# Patient Record
Sex: Male | Born: 1960 | Race: White | Hispanic: No | Marital: Married
Health system: Southern US, Community
[De-identification: ages and names within clinical notes are randomized; demographics above are authoritative.]

---

## 2017-08-25 ENCOUNTER — Emergency Department (HOSPITAL_COMMUNITY)
Admission: EM | Admit: 2017-08-25 | Discharge: 2017-08-25 | Disposition: A | Discharge status: Home / Self Care | Payer: No Typology Code available for payment source | Attending: Emergency Medicine | Admitting: Emergency Medicine

## 2017-08-25 ENCOUNTER — Emergency Department (HOSPITAL_COMMUNITY): Payer: No Typology Code available for payment source

## 2017-08-25 DIAGNOSIS — Y999 Unspecified external cause status: Secondary | ICD-10-CM | POA: Diagnosis not present

## 2017-08-25 DIAGNOSIS — Y9389 Activity, other specified: Secondary | ICD-10-CM | POA: Diagnosis not present

## 2017-08-25 DIAGNOSIS — Y929 Unspecified place or not applicable: Secondary | ICD-10-CM | POA: Diagnosis not present

## 2017-08-25 DIAGNOSIS — M542 Cervicalgia: Secondary | ICD-10-CM | POA: Insufficient documentation

## 2017-08-25 DIAGNOSIS — R079 Chest pain, unspecified: Secondary | ICD-10-CM | POA: Diagnosis present

## 2017-08-25 MED ORDER — NAPROXEN 500 MG PO TABS: 500.0000 mg | ORAL_TABLET | Freq: Two times a day (BID) | ORAL | 0 refills | Status: AC
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
## 2017-08-25 NOTE — ED Notes (Signed)
Declined W/C at D/C and was escorted to lobby by RN. 

## 2017-08-29 ENCOUNTER — Emergency Department (HOSPITAL_COMMUNITY)
Admission: EM | Admit: 2017-08-29 | Discharge: 2017-08-29 | Disposition: A | Discharge status: Home / Self Care | Payer: No Typology Code available for payment source | Attending: Emergency Medicine | Admitting: Emergency Medicine

## 2017-08-29 ENCOUNTER — Emergency Department (HOSPITAL_COMMUNITY): Payer: No Typology Code available for payment source

## 2017-08-29 DIAGNOSIS — R079 Chest pain, unspecified: Secondary | ICD-10-CM | POA: Diagnosis not present

## 2017-08-29 DIAGNOSIS — Z5321 Procedure and treatment not carried out due to patient leaving prior to being seen by health care provider: Secondary | ICD-10-CM | POA: Insufficient documentation

## 2017-08-29 NOTE — ED Notes (Signed)
Patient stated he was going to leave and couldn't wait any longer. He reports feeling a little better than when he came in and would rather come back tomorrow or go somewhere else. Patient in no acute distress, ambulatory with steady gait. Resp e/u.
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
## 2017-08-29 NOTE — ED Notes (Signed)
Called 3x no response 

## 2019-05-15 IMAGING — DX DG CHEST 2V
3 series · 3 of 3 positions shown · non-contrast
Comparison: 08/25/2017

CLINICAL DATA: Shortness of breath

EXAM:
CHEST  2 VIEW

[w chest pa]
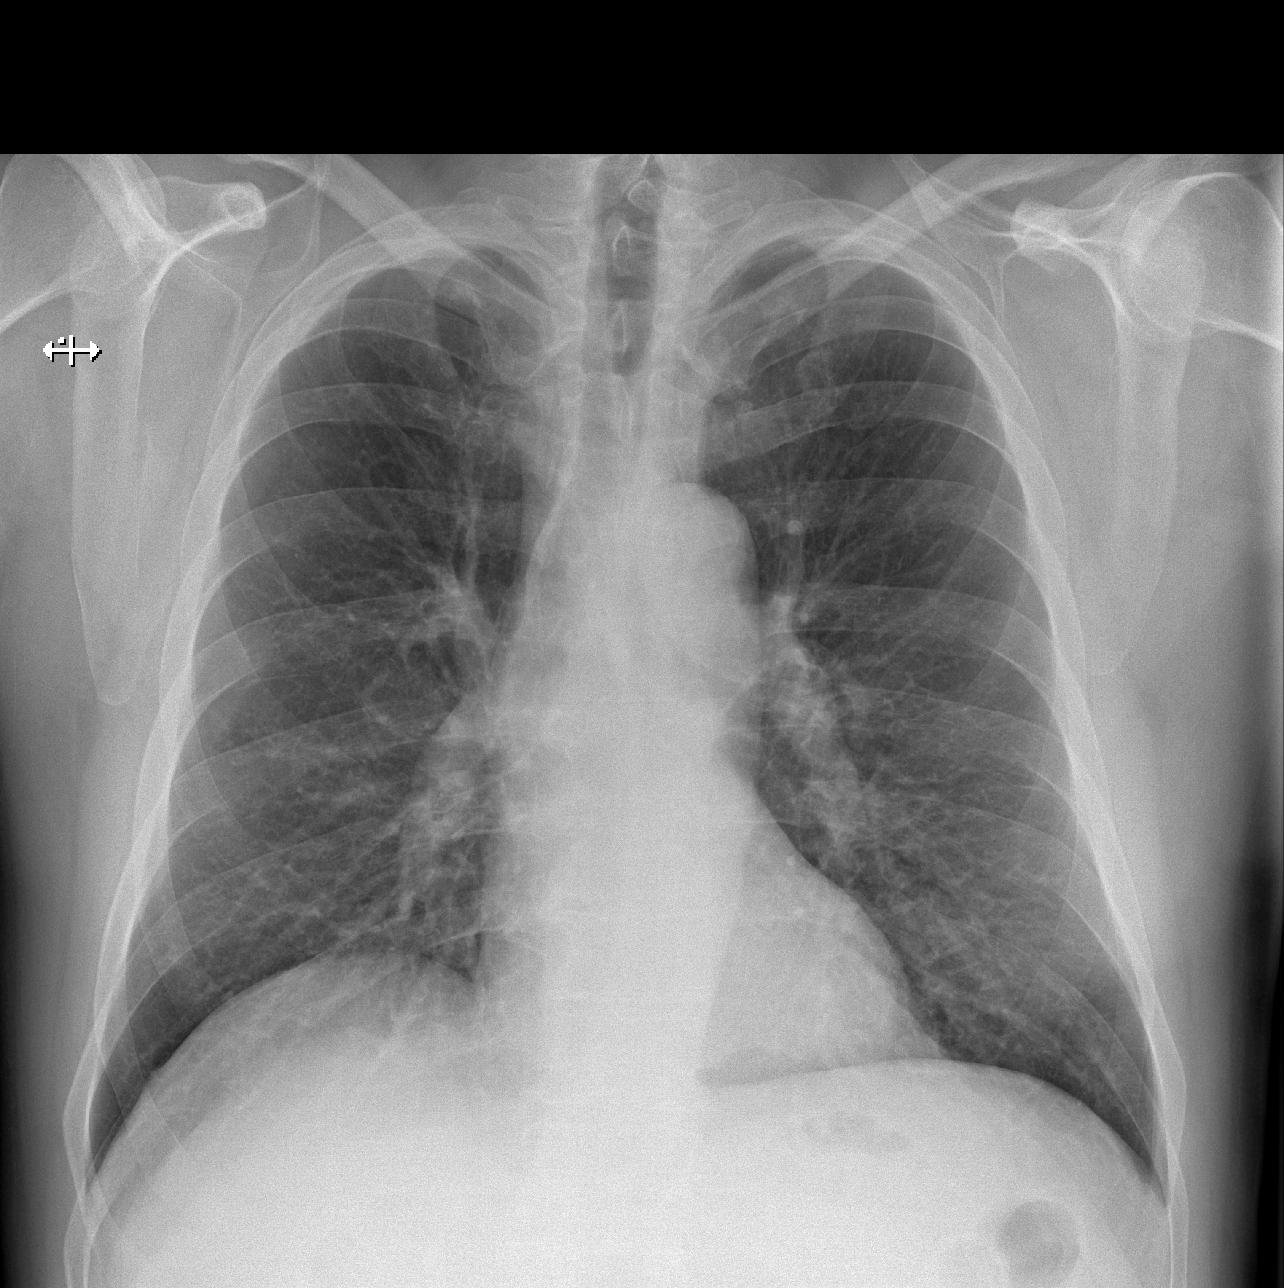

[w chest lat (1 of 2)]
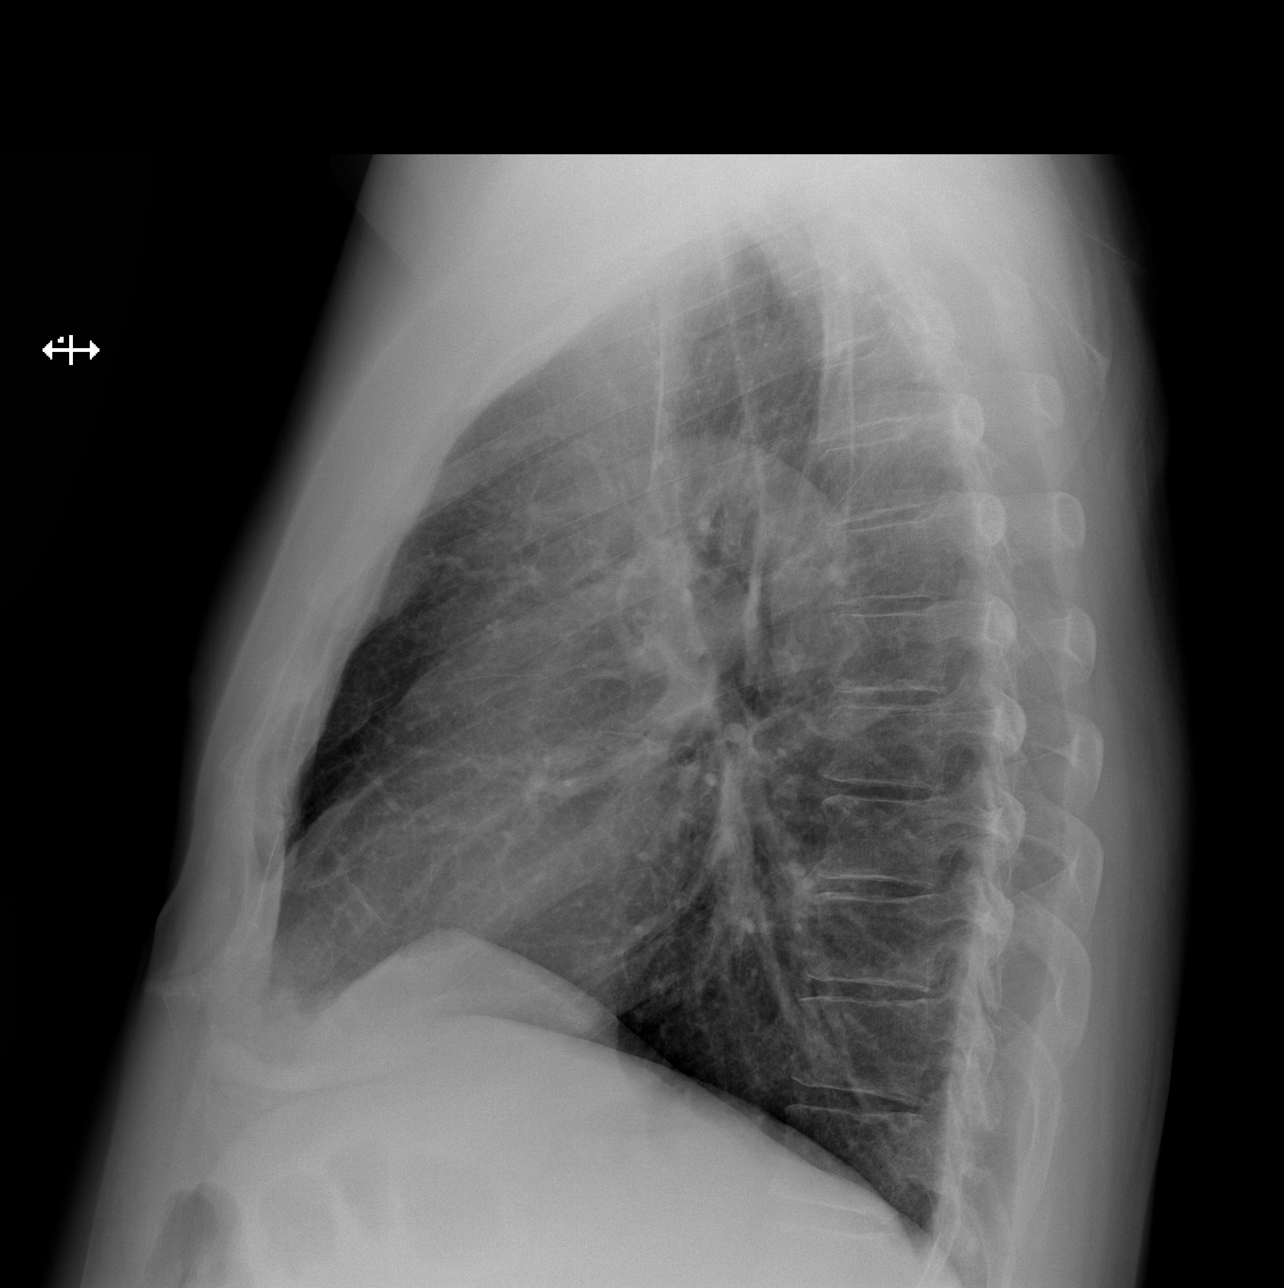

[w chest lat (2 of 2)]
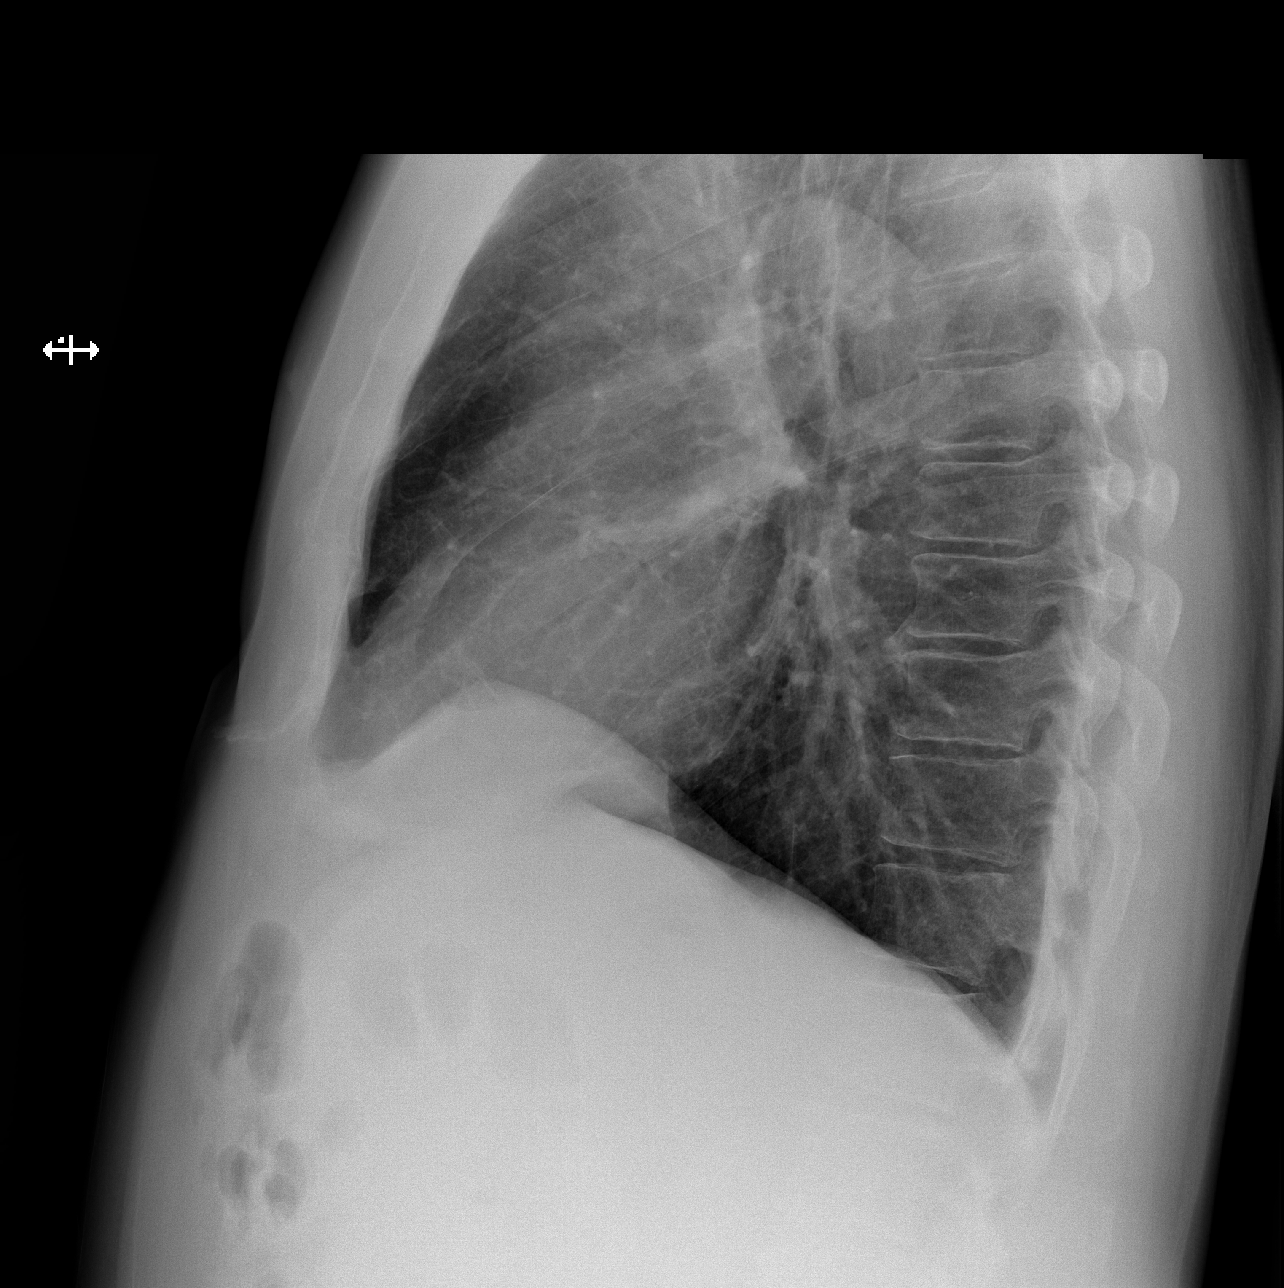

[3 of 3 positions shown; findings below may reference images not displayed]

FINDINGS: Normal heart size. Unremarkable mediastinal contours. There is no
edema, consolidation, effusion, or pneumothorax.
IMPRESSION: No evidence of active disease.

## 2021-02-22 ENCOUNTER — Emergency Department (HOSPITAL_COMMUNITY)
Admission: EM | Admit: 2021-02-22 | Discharge: 2021-03-25 | Disposition: E | Payer: Self-pay | Attending: Emergency Medicine | Admitting: Emergency Medicine

## 2021-02-22 DIAGNOSIS — I469 Cardiac arrest, cause unspecified: Secondary | ICD-10-CM | POA: Insufficient documentation

## 2021-02-22 MED ORDER — EPINEPHRINE 1 MG/10ML IJ SOSY
PREFILLED_SYRINGE | INTRAMUSCULAR | Status: AC | PRN
  Administered 2021-02-22 (×2): 1 mg via INTRAVENOUS

## 2021-02-22 MED ORDER — SODIUM BICARBONATE 8.4 % IV SOLN
INTRAVENOUS | Status: AC | PRN
  Administered 2021-02-22 (×2): 50 meq via INTRAVENOUS

## 2021-03-25 NOTE — ED Triage Notes (Signed)
Patient arrives with [facility] from home, patient with "PNA-like symptoms" x 3 days, EMS stated wife heard him fall in the bathroom, initial rhythm PEA, 14 shocks, 11 epi, 450 amio, bicarb and calcium given, 7.0 ETT 22 at teeth, initial downtime start 0054.

## 2021-03-25 NOTE — ED Provider Notes (Signed)
[facility] EMERGENCY DEPARTMENT Provider Note   CSN: [id number] Arrival date & time: 19-Mar-2021  0217     History Chief Complaint  Patient presents with  . Cardiac Arrest    Frank Brown is a 60 y.o. male.  HPI     This 60 year old male with no reported past medical history who presents in cardiac arrest.  Per EMS they started CPR at 055.  Wife noted over the day that he had had increasing shortness of breath.  He used a portable pulse ox and noted that his heart rate was elevated.  Early this morning, he went to the bathroom and the wife heard a thud.  She found him on the floor.  Upon EMS arrival he was pulseless.  EMS reports that he went between PEA and V. fib.  He received approximately 11 doses of epinephrine and 11 shocks in the field.  He also received calcium, bicarbonate, and 450 of amiodarone.  Upon arrival to the trauma bay, he remains in PEA.  He has not had no meaningful neurologic movement.  Pupils have been fixed and dilated per EMS for the duration of their resuscitation.  Level 5 caveat for unresponsiveness.  No past medical history on file.  There are no problems to display for this patient.  Unknown PMH  No family history on file.     Home Medications Prior to Admission medications   Medication Sig Start Date End Date Taking? Authorizing Provider  naproxen (NAPROSYN) 500 MG tablet Take 1 tablet (500 mg total) by mouth 2 (two) times daily. 08/25/17   Maczis, Elmer Sow, PA-C    Allergies    Patient has no known allergies.  Review of Systems   Review of Systems  Unable to perform ROS: Intubated    Physical Exam Updated Vital Signs BP (!) 0/0   Pulse (!) 0   Resp (!) 0   Physical Exam Vitals and nursing note reviewed.  Constitutional:      Comments: Spots of, intubated, GCS 3  HENT:     Head:     Comments: Abrasion over the forehead    Nose: Nose normal.  Eyes:     Comments: Pupils 9 mm bilaterally, fixed  Cardiovascular:      Comments: PEA, pulseless Pulmonary:     Comments: No respiratory effort, good end-tidal CO2 Abdominal:     General: There is distension.     Palpations: Abdomen is soft.  Musculoskeletal:        General: No swelling.     Comments: IO left tibia  Skin:    Comments: Pale  Neurological:     Comments: GCS 3  Psychiatric:     Comments: Unable to assess     ED Results / Procedures / Treatments   Labs (all labs ordered are listed, but only abnormal results are displayed) Labs Reviewed - No data to display  EKG None  Radiology No results found.  Procedures .Critical Care Performed by: Shon Baton, MD Authorized by: Shon Baton, MD   Critical care provider statement:    Critical care time (minutes):  45   Critical care was necessary to treat or prevent imminent or life-threatening deterioration of the following conditions:  Cardiac failure and circulatory failure   Critical care was time spent personally by me on the following activities:  Discussions with consultants, evaluation of patient's response to treatment, examination of patient, ordering and performing treatments and interventions, ordering and review of laboratory  studies, ordering and review of radiographic studies, pulse oximetry, re-evaluation of patient's condition, obtaining history from patient or surrogate and review of old charts CPR  Date/Time: 27-Feb-2021 2:41 AM Performed by: Shon Baton, MD Authorized by: Shon Baton, MD  CPR Procedure Details:    ACLS/BLS initiated by EMS: Yes     CPR/ACLS performed in the ED: Yes     Duration of CPR (minutes):  10   Outcome: Pt declared dead    CPR performed via ACLS guidelines under my direct supervision.  See RN documentation for details including defibrillator use, medications, doses and timing.     Medications Ordered in ED Medications  EPINEPHrine (ADRENALIN) 1 MG/10ML injection (1 mg Intravenous Given 2021/02/27 0221)  sodium  bicarbonate injection (50 mEq Intravenous Given 02/27/2021 0221)    ED Course  I have reviewed the triage vital signs and the nursing notes.  Pertinent labs & imaging results that were available during my care of the patient were reviewed by me and considered in my medical decision making (see chart for details).    MDM Rules/Calculators/A&P                          Patient presents in cardiac arrest.  Received advanced resuscitative efforts in the field.  On my evaluation he appears to be in PEA.  He has no meaningful neurologic exam and has been down for approximately 1.5 hours.  He was given 2 additional doses of epinephrine as well as bicarbonate.  He was in persistent PEA.  Bedside ultrasound shows no coordinated cardiac activity.  Time of death 0224.  Medical examiner advised of patient death.  Accepted as ME case.    Spoke with patient's wife, Frank Brown.  Wife advised of patient's death.  Final Clinical Impression(s) / ED Diagnoses Final diagnoses:  Cardiac arrest [facility])    Rx / DC Orders ED Discharge Orders    None       Nyara Capell, Mayer Masker, MD 02/27/2021 [phone]

## 2021-03-25 DEATH — deceased
